# Patient Record
Sex: Female | Born: 1985 | Race: White | Hispanic: No | Marital: Married | Smoking: Never smoker
Health system: Southern US, Community
[De-identification: ages and names within clinical notes are randomized; demographics above are authoritative.]

## PROBLEM LIST (undated history)

## (undated) DIAGNOSIS — Z789 Other specified health status: Secondary | ICD-10-CM

## (undated) HISTORY — PX: WISDOM TOOTH EXTRACTION: SHX21

---

## 2014-12-22 ENCOUNTER — Encounter (HOSPITAL_COMMUNITY): Payer: Self-pay | Admitting: Obstetrics and Gynecology

## 2014-12-28 ENCOUNTER — Encounter (HOSPITAL_COMMUNITY): Payer: Self-pay

## 2014-12-28 ENCOUNTER — Ambulatory Visit (HOSPITAL_COMMUNITY)
Admission: RE | Admit: 2014-12-28 | Discharge: 2014-12-28 | Disposition: A | Discharge status: Home / Self Care | Payer: Federal, State, Local not specified - PPO | Source: Ambulatory Visit | Attending: Obstetrics and Gynecology | Admitting: Obstetrics and Gynecology

## 2014-12-28 ENCOUNTER — Other Ambulatory Visit (HOSPITAL_COMMUNITY): Payer: Self-pay | Admitting: Maternal and Fetal Medicine

## 2014-12-28 DIAGNOSIS — Z3A19 19 weeks gestation of pregnancy: Secondary | ICD-10-CM | POA: Diagnosis not present

## 2014-12-28 HISTORY — DX: Other specified health status: Z78.9

## 2015-01-03 NOTE — Progress Notes (Signed)
MATERNAL FETAL MEDICINE CONSULT  Patient Name: Diane Baker Medical Record Number:  [id number] Date of Birth: May 22, 1985 Requesting Physician Name:  Dr. Lamona Curl Date of Service: 01/03/2015  Chief Complaint Vasa previa  History of Present Illness Diane Baker was seen today secondary to vasa previa at the request of Dr. Lamona Curl.  The patient is a 29 y.o. G2P0010,at [redacted]w[redacted]d with an EDD of 05/23/2015, by Last Menstrual Period dating method.  She was noted to have a vasa previa on an ultrasound performed at her primary OBs office.  She comes today for ultrasound confirmation and discussions of management options.  Review of Systems Pertinent items are noted in HPI.  Patient History OB History  Gravida Para Term Preterm AB SAB TAB Ectopic Multiple Living  # Outcome Date GA Lbr Len/2nd Weight Sex Delivery Anes PTL Lv  2 Current           1 SAB               Past Medical History  Diagnosis Date  . Medical history non-contributory     Past Surgical History  Procedure Laterality Date  . Wisdom tooth extraction      Social History   Social History  . Marital Status: Married    Spouse Name: N/A  . Number of Children: N/A  . Years of Education: N/A   Social History Main Topics  . Smoking status: Never Smoker   . Smokeless tobacco: None  . Alcohol Use: No  . Drug Use: No  . Sexual Activity: Not Asked   Other Topics Concern  . None   Social History Narrative    No family history on file. In addition, the patient has no family history of mental retardation, birth defects, or genetic diseases.  Physical Examination There were no vitals filed for this visit. General appearance - alert, well appearing, and in no distress Abdomen - soft, nontender, nondistended, no masses or organomegaly Extremities - no pedal edema noted  Assessment and Recommendations 1.  Vasa Previa.  A vasa previa was confirmed on ultrasound.  There are several large vessels that cross  over or close to the internal os.  The management of vasa previa is controversial with several different approaches described in the literature.  The most aggressive includes hospitalization at the time of viability for close monitoring of labor followed by delivery via LTCS at 32 weeks.  This is to avoid spontaneous labor prior to delivery, as labor can result in a laceration of the fetal vessels near the cervix, that nearly always leads to fetal death if the patient is not delivered within minutes.  Less aggressive approaches include frequent outpatient visit to assess for symptoms of preterm labor with delivery at 34-36 weeks.  I think Ms. Printup ultimate management plan will be somewhere in between the two extremes.  The decision to hospitalize is driven mostly by the risk of preterm labor, which can be assessed by cervical length measurement.  If for example Ms. Riege was found to have a cervical length of greater than 2.5 cm around the time of viability indicating a low risk of preterm labor, it would be reasonable to delay hospitalization until later into pregnancy.  However, if she were having uterine activity and a shortened cervix hospitalization would be the best option.  We will reassess her risk of early labor at the time of her next ultrasound, which is scheduled in  approximately 4 weeks.  I spent 30 minutes with Ms. Vedia CofferBlack today of which 50% was face-to-face counseling.  [facility].  Please do not hesitate to contact us with questions.   Rema FendtNITSCHE,Lauris Serviss, MD

## 2015-01-06 ENCOUNTER — Other Ambulatory Visit (HOSPITAL_COMMUNITY): Payer: Self-pay

## 2015-01-06 ENCOUNTER — Encounter (HOSPITAL_COMMUNITY): Payer: Self-pay

## 2015-01-25 ENCOUNTER — Encounter (HOSPITAL_COMMUNITY): Payer: Self-pay

## 2015-01-25 ENCOUNTER — Ambulatory Visit (HOSPITAL_COMMUNITY)
Admission: RE | Admit: 2015-01-25 | Discharge: 2015-01-25 | Disposition: A | Discharge status: Home / Self Care | Payer: Federal, State, Local not specified - PPO | Source: Ambulatory Visit | Attending: Obstetrics and Gynecology | Admitting: Obstetrics and Gynecology

## 2015-01-25 DIAGNOSIS — Z36 Encounter for antenatal screening of mother: Secondary | ICD-10-CM | POA: Diagnosis not present

## 2015-01-25 DIAGNOSIS — Z3A23 23 weeks gestation of pregnancy: Secondary | ICD-10-CM | POA: Insufficient documentation

## 2015-02-08 ENCOUNTER — Other Ambulatory Visit (HOSPITAL_COMMUNITY): Payer: Self-pay | Admitting: Maternal and Fetal Medicine

## 2015-02-08 ENCOUNTER — Encounter (HOSPITAL_COMMUNITY): Payer: Self-pay

## 2015-02-08 ENCOUNTER — Ambulatory Visit (HOSPITAL_COMMUNITY)
Admission: RE | Admit: 2015-02-08 | Discharge: 2015-02-08 | Disposition: A | Discharge status: Home / Self Care | Payer: Federal, State, Local not specified - PPO | Source: Ambulatory Visit | Attending: Obstetrics and Gynecology | Admitting: Obstetrics and Gynecology

## 2015-02-08 DIAGNOSIS — Z3A25 25 weeks gestation of pregnancy: Secondary | ICD-10-CM

## 2015-02-23 ENCOUNTER — Other Ambulatory Visit (HOSPITAL_COMMUNITY): Payer: Self-pay | Admitting: Maternal and Fetal Medicine

## 2015-02-23 ENCOUNTER — Ambulatory Visit (HOSPITAL_COMMUNITY)
Admission: RE | Admit: 2015-02-23 | Discharge: 2015-02-23 | Disposition: A | Discharge status: Home / Self Care | Payer: Federal, State, Local not specified - PPO | Source: Ambulatory Visit | Attending: Obstetrics and Gynecology | Admitting: Obstetrics and Gynecology

## 2015-02-23 ENCOUNTER — Encounter (HOSPITAL_COMMUNITY): Payer: Self-pay

## 2015-02-23 DIAGNOSIS — Z3A27 27 weeks gestation of pregnancy: Secondary | ICD-10-CM | POA: Diagnosis not present

## 2015-03-09 ENCOUNTER — Ambulatory Visit (HOSPITAL_COMMUNITY)
Admission: RE | Admit: 2015-03-09 | Discharge: 2015-03-09 | Disposition: A | Discharge status: Home / Self Care | Payer: Federal, State, Local not specified - PPO | Source: Ambulatory Visit | Attending: Obstetrics and Gynecology | Admitting: Obstetrics and Gynecology

## 2015-03-09 ENCOUNTER — Encounter (HOSPITAL_COMMUNITY): Payer: Self-pay

## 2015-03-09 DIAGNOSIS — Z36 Encounter for antenatal screening of mother: Secondary | ICD-10-CM | POA: Insufficient documentation

## 2015-03-09 DIAGNOSIS — Z3A29 29 weeks gestation of pregnancy: Secondary | ICD-10-CM | POA: Insufficient documentation

## 2015-03-09 DIAGNOSIS — O24410 Gestational diabetes mellitus in pregnancy, diet controlled: Secondary | ICD-10-CM | POA: Diagnosis not present

## 2015-03-15 ENCOUNTER — Ambulatory Visit (HOSPITAL_COMMUNITY): Payer: Federal, State, Local not specified - PPO

## 2015-03-16 ENCOUNTER — Ambulatory Visit (HOSPITAL_COMMUNITY): Payer: Federal, State, Local not specified - PPO

## 2015-11-02 ENCOUNTER — Encounter (HOSPITAL_COMMUNITY): Payer: Self-pay

## 2017-05-30 ENCOUNTER — Encounter (HOSPITAL_COMMUNITY): Payer: Self-pay

## 2017-05-30 ENCOUNTER — Ambulatory Visit (HOSPITAL_COMMUNITY): Payer: Federal, State, Local not specified - PPO | Attending: Obstetrics and Gynecology

## 2017-11-05 IMAGING — US US MFM OB TRANSVAGINAL
1 series · 14 of 28 positions shown · non-contrast
Comparison: none

[Series 1: us mfm ob transvaginal · 76 acquisitions, 14 frames shown]
[im 3/76]
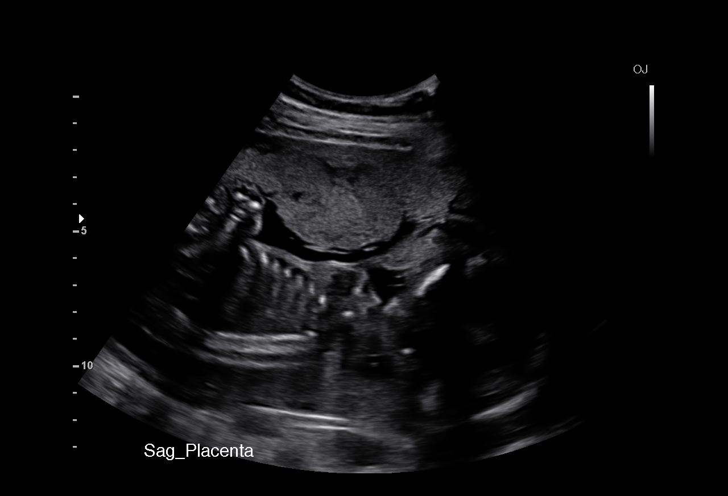
[im 9/76]
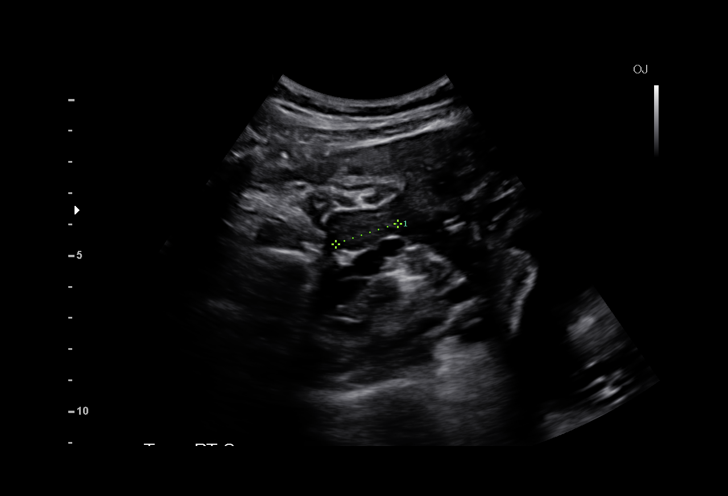
[im 14/76]
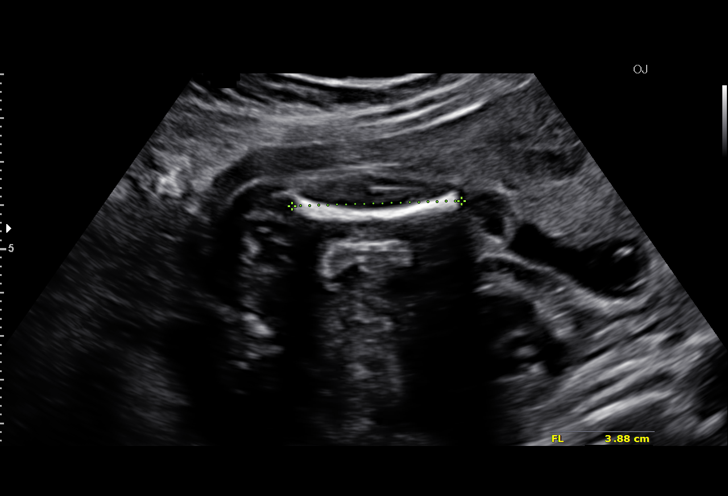
[im 20/76]
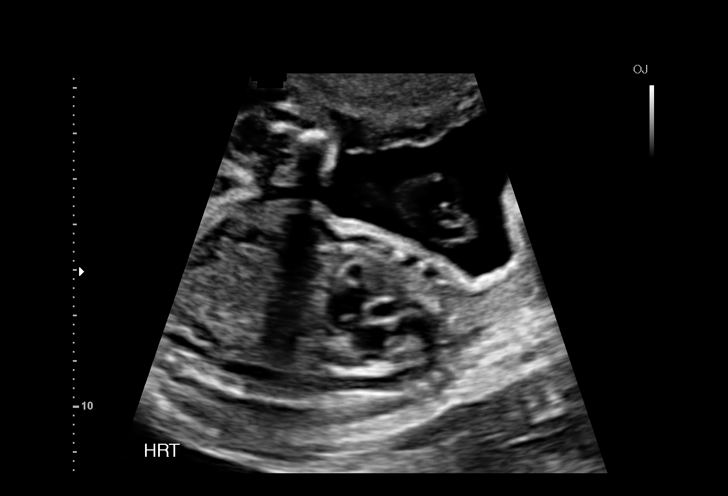
[im 26/76]
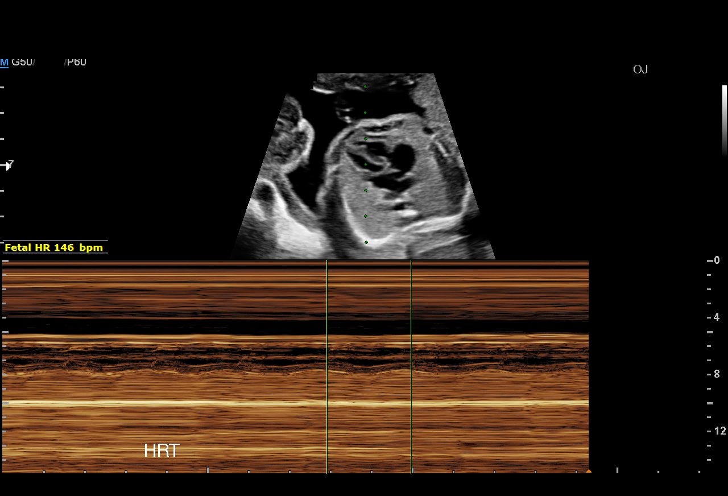
[im 31/76]
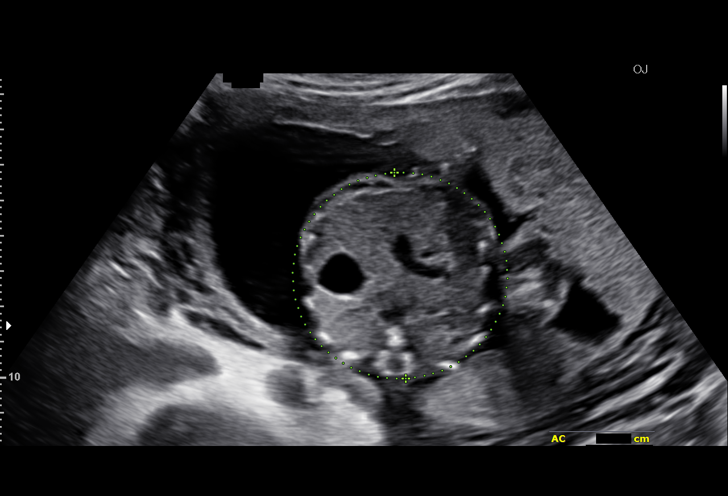
[im 37/76]
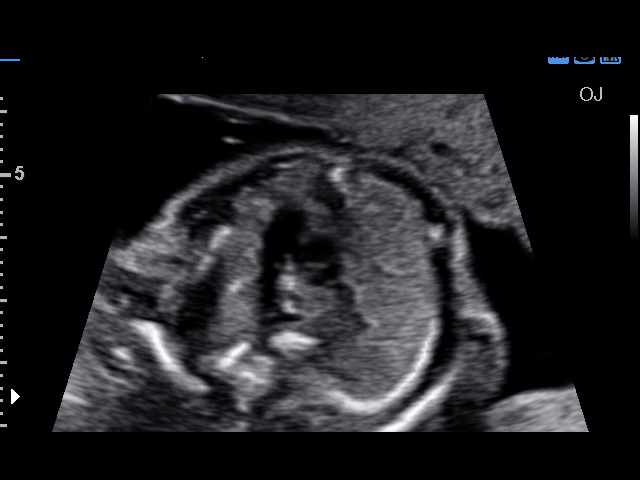
[im 42/76]
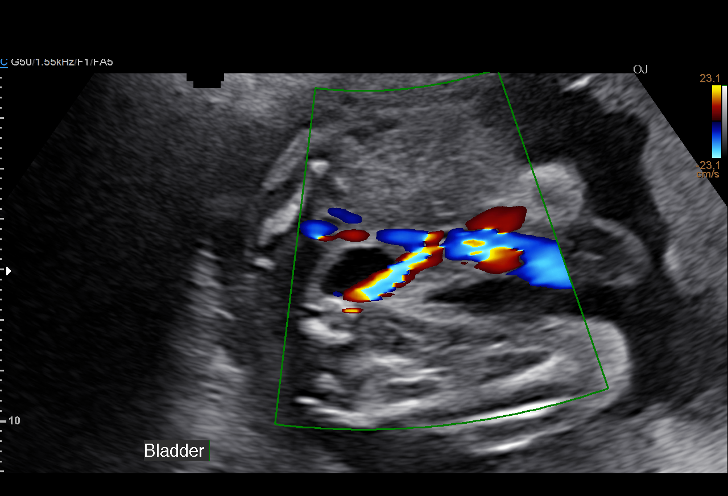
[im 48/76]
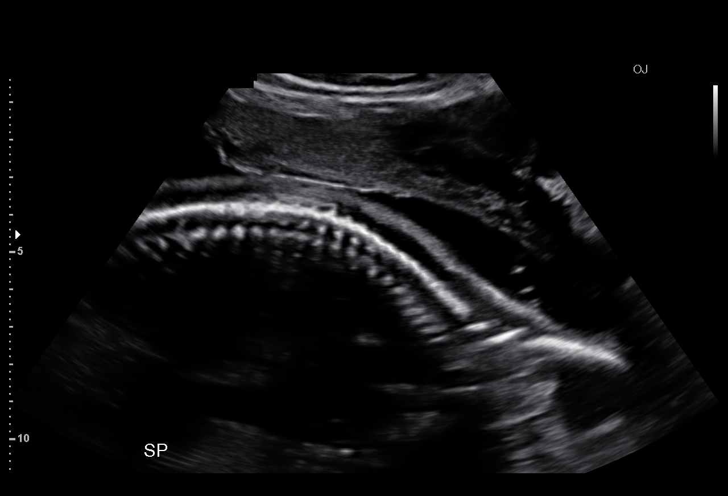
[im 53/76]
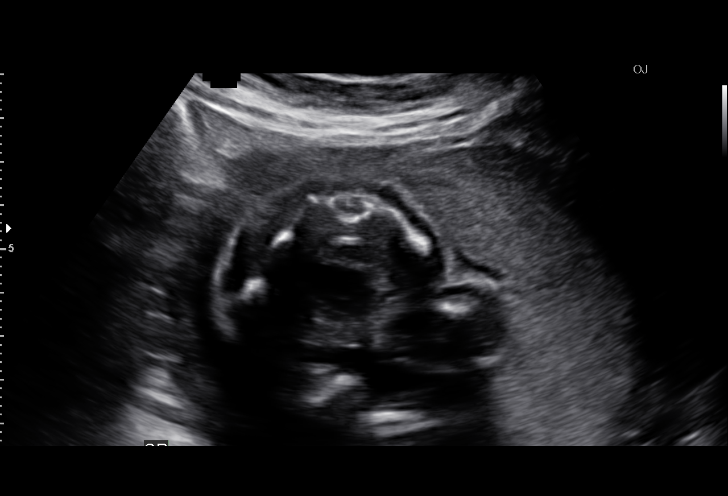
[im 59/76]
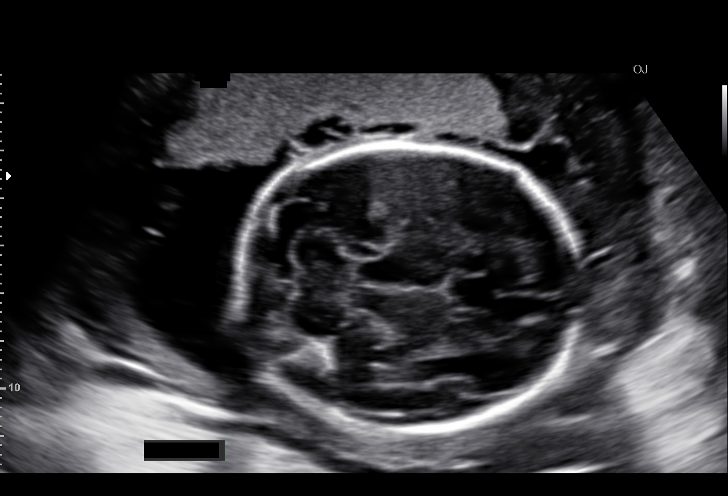
[im 64/76]
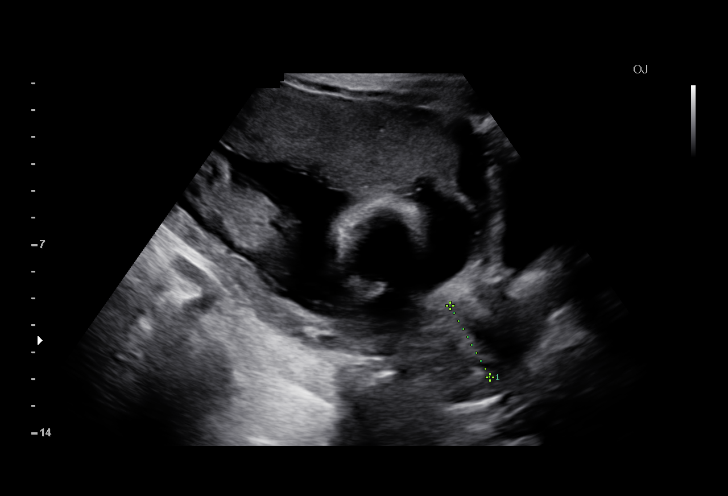
[im 70/76]
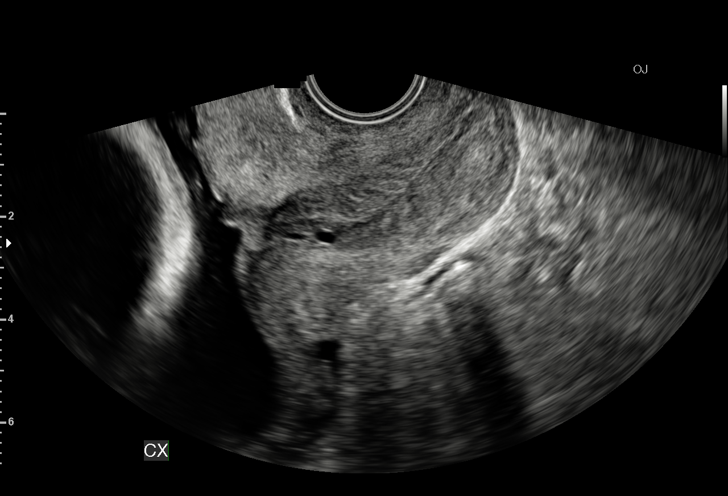
[im 76/76]
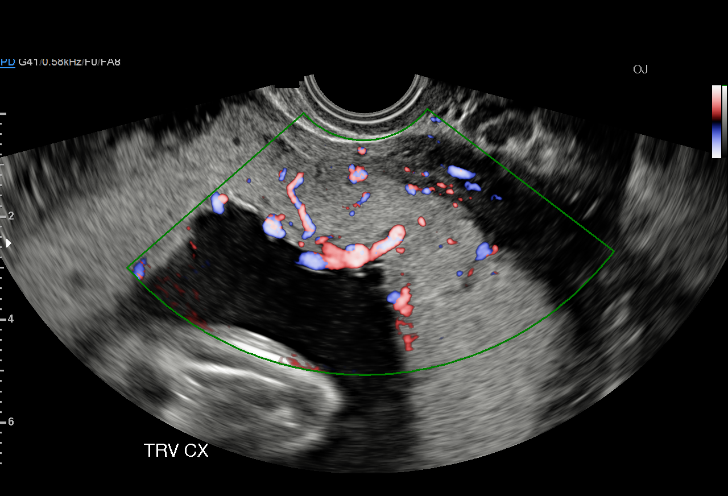

[14 of 28 positions shown; findings below may reference images not displayed]

[REDACTED],
[address]
[HOSPITAL], [place]
MAN KUI DO

1  SOLITARIO SAX           [id number]      [id number]     [id number]
2  SOLITARIO SAX           [id number]      [id number]     [id number]
Indications

23 weeks gestation of pregnancy
Basic anatomic survey                          Z36
Vasa previa
OB History

Gravidity:    2         Term:   0        Prem:   0        SAB:   1
TOP:          0       Ectopic:  0        Living: 0
Fetal Evaluation

Num Of Fetuses:     1
Fetal Heart         146
Rate(bpm):
Cardiac Activity:   Observed
Presentation:       Cephalic
Placenta:           Anterior left lateral
P. Cord Insertion:  Velamentous insertion

Amniotic Fluid
AFI FV:      Subjectively within normal limits
Larg Pckt:     5.9  cm
Biometry

BPD:      59.5  mm     G. Age:  24w 2d                  CI:        75.83   %    70 - 86
FL/HC:      17.7   %    19.2 -
HC:      216.6  mm     G. Age:  23w 5d         57  %    HC/AC:      1.19        1.05 -
AC:      182.6  mm     G. Age:  23w 0d         40  %    FL/BPD:     64.4   %    71 - 87
FL:       38.3  mm     G. Age:  22w 2d         15  %    FL/AC:      21.0   %    20 - 24
HUM:      37.1  mm     G. Age:  23w 0d         39  %
CER:      23.5  mm     G. Age:  21w 5d         24  %

Est. FW:     543  gm      1 lb 3 oz     49  %
Gestational Age

LMP:           23w 1d        Date:  08/16/14                 EDD:   05/23/15
U/S Today:     23w 2d                                        EDD:   05/22/15
Best:          23w 1d     Det. By:  LMP  (08/16/14)          EDD:   05/23/15
Anatomy

Cranium:          Appears normal         Aortic Arch:      Appears normal
Fetal Cavum:      Appears normal         Ductal Arch:      Appears normal
Ventricles:       Appears normal         Diaphragm:        Appears normal
Choroid Plexus:   Appears normal         Stomach:          Appears normal, left
sided
Cerebellum:       Appears normal         Abdomen:          Appears normal
Posterior Fossa:  Appears normal         Abdominal Wall:   Appears nml (cord
insert, abd wall)
Nuchal Fold:      Appears normal         Cord Vessels:     Appears normal (3
vessel cord)
Face:             Appears normal         Kidneys:          Appear normal
(orbits and profile)
Lips:             Appears normal         Bladder:          Appears normal
Heart:            Appears normal         Spine:            Appears normal
(4CH, axis, and
situs)
RVOT:             Appears normal         Upper             Appears normal
Extremities:
LVOT:             Appears normal         Lower             Appears normal
Extremities:

Other:  Male gender. Heels visualized.
Cervix Uterus Adnexa

Cervix
Length:              3  cm.
Normal appearance by transabdominal scan.

Left Ovary
Size(cm)     2.53   x   1.29   x  2.1       Vol(ml):
Within normal limits.

Right Ovary
Size(cm)     2.64   x   1.11   x  2.09      Vol(ml):
Within normal limits.
Impression

Single IUP at 23w 1d
Follow up due to suspected vasa previa
Normal fetal anaatmic survey
Fetal growth is appropriate (49th %tile)
Normal amniotic fluid volume
A velamentous cord insertion is noted along the leading edge
of the anterior placenta.
Some fetal vessels are again appreciated that course over
the internal os consistent with vasa previa
TVUS - cervical length 3 cm without funneling
Recommendations

Recommend follow up ultrasound in 2 weeks for cervical
length.
If shortened cervix is noted (< 2.5 cm) on follow up, would
consider inpatient obsertation and betamethsasone.
If stable and vasa previa is again confirmed, would consider
inpatient admission at 30-32 weeks with planned C-section at

34 weeks.

## 2017-11-19 IMAGING — US US MFM OB TRANSVAGINAL
1 series · 15 of 28 positions shown · non-contrast
Comparison: none

[Series 1: us mfm ob transvaginal · 33 acquisitions, 15 frames shown]
[im 1/33]
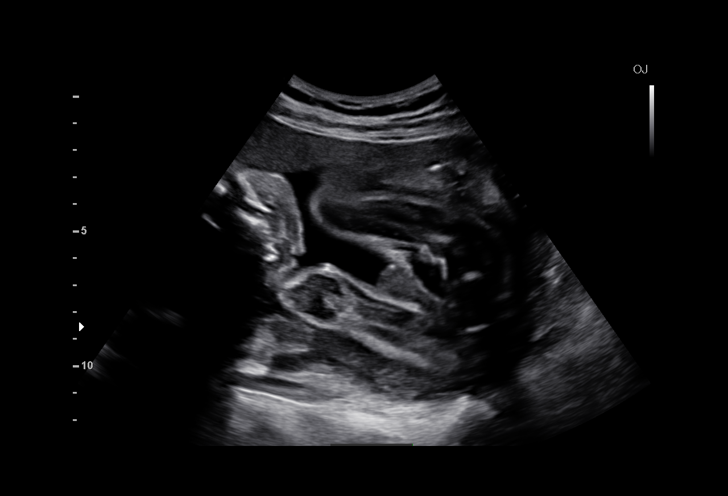
[im 3/33]
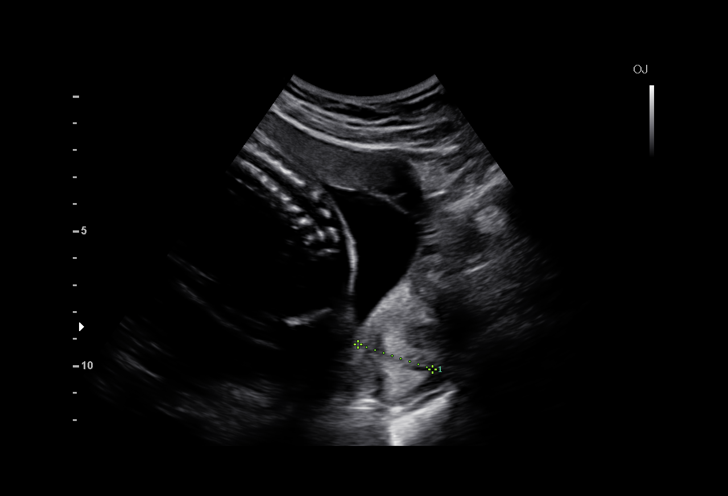
[im 5/33]
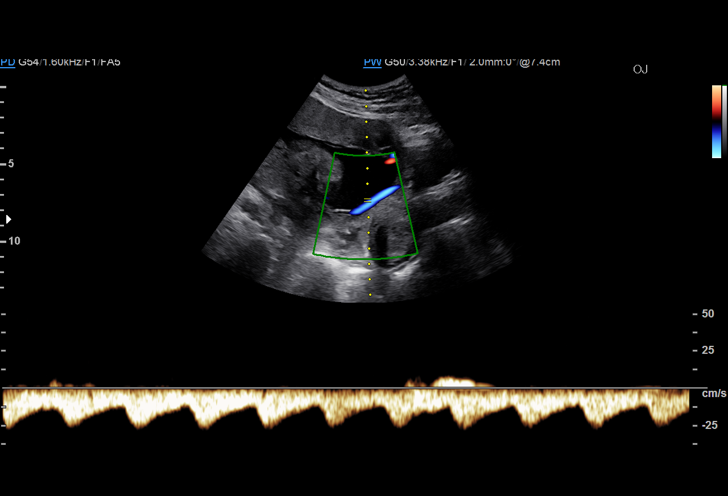
[im 8/33]
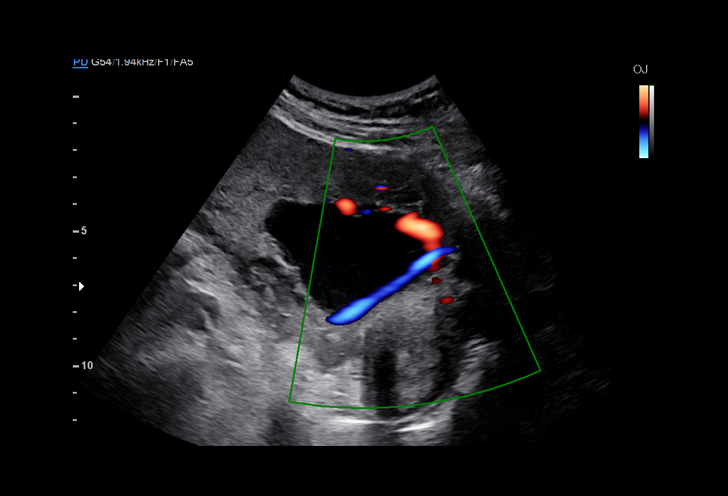
[im 10/33]
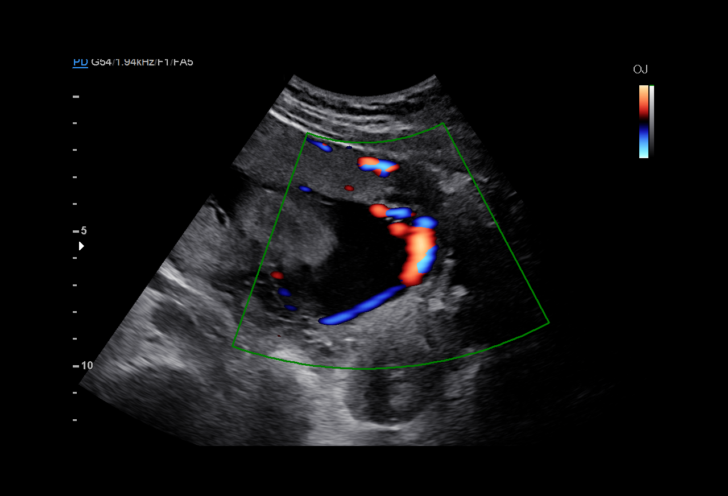
[im 12/33]
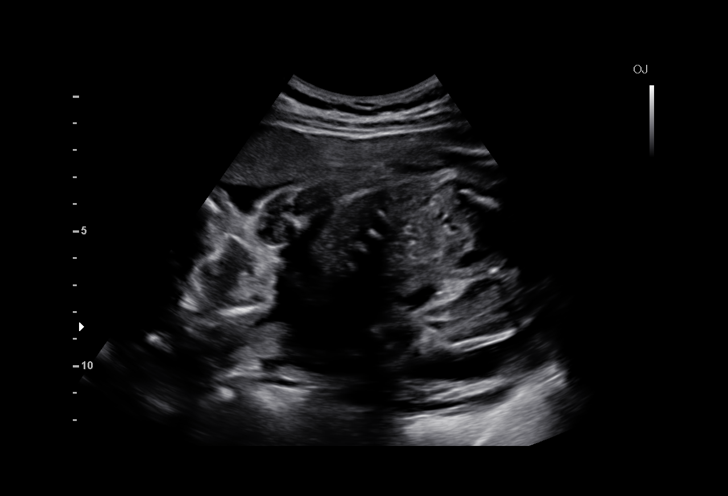
[im 15/33]
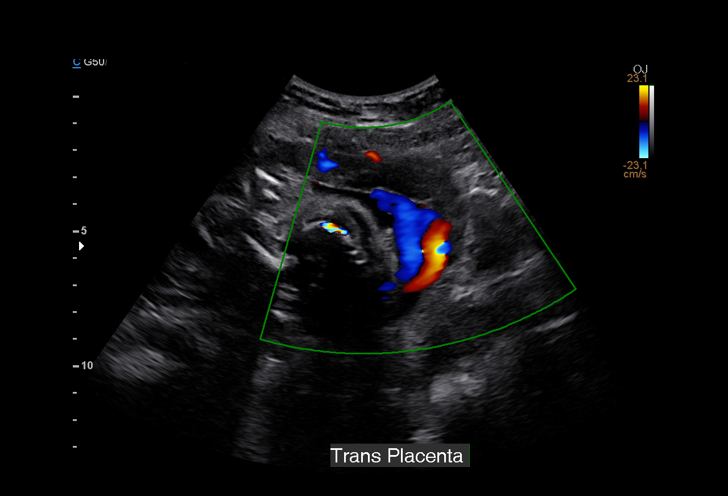
[im 17/33]
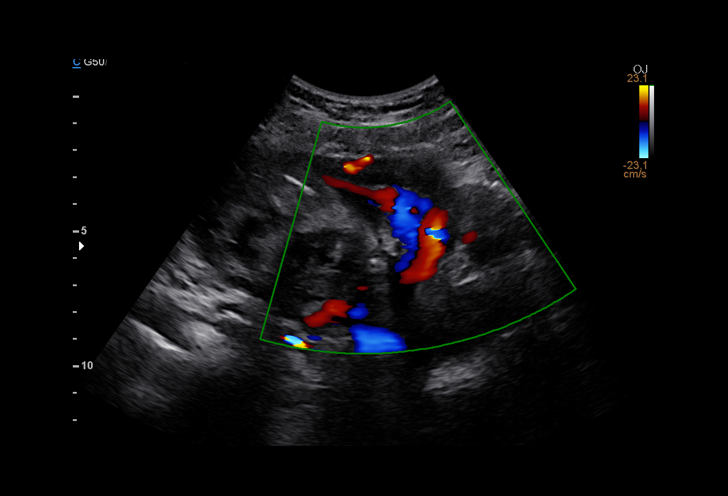
[im 18/33]
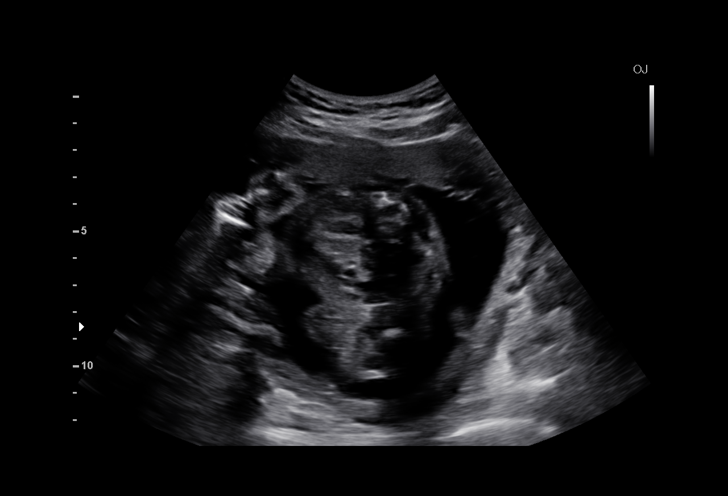
[im 21/33]
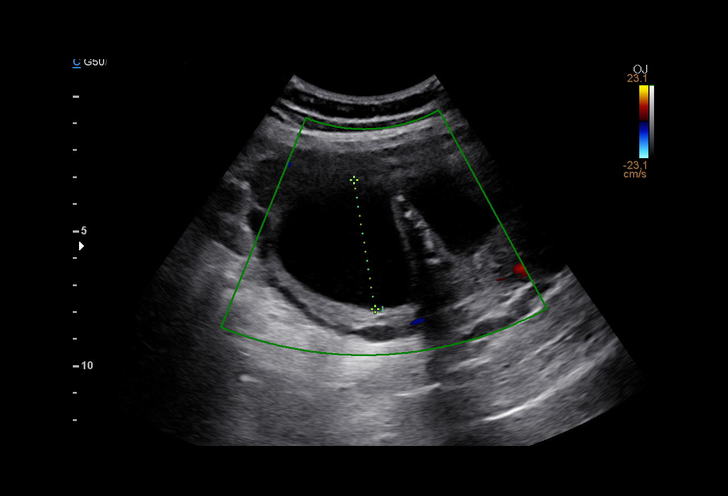
[im 23/33]
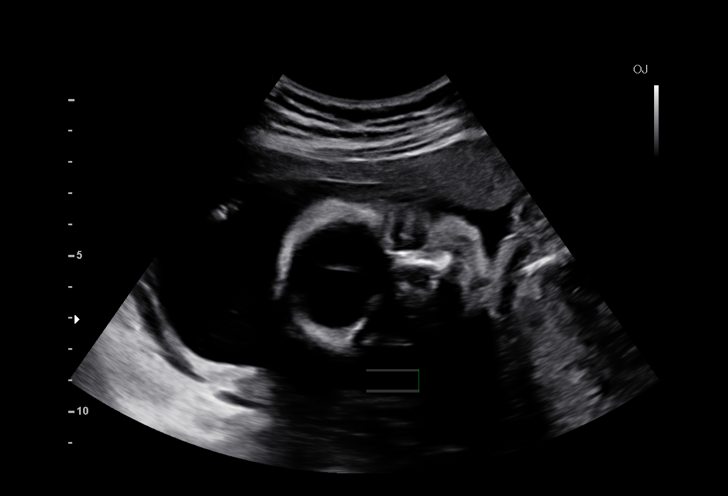
[im 25/33]
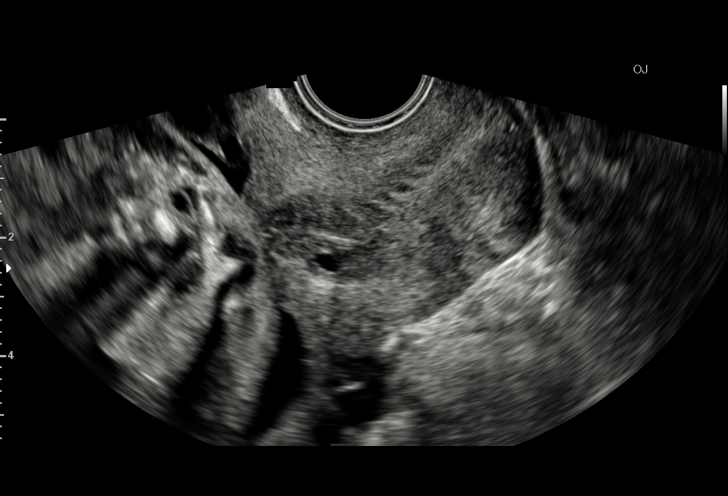
[im 28/33]
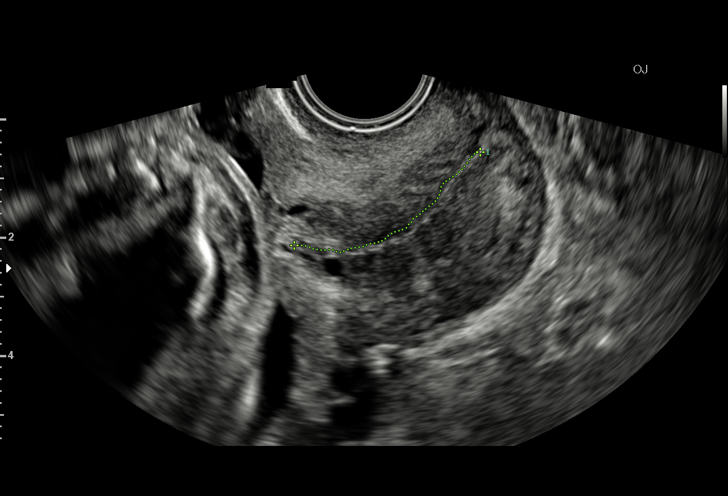
[im 30/33]
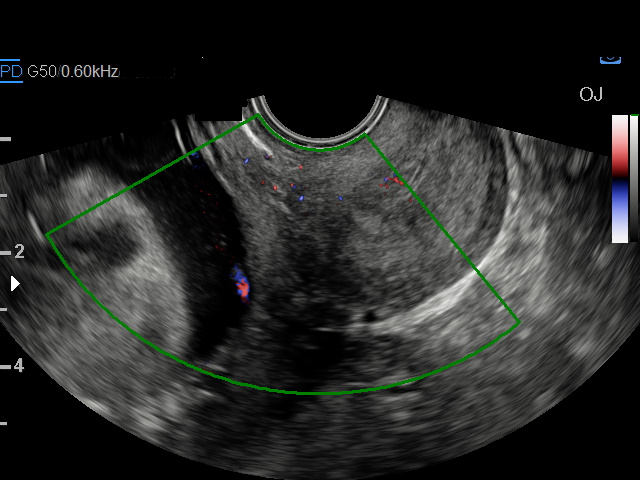
[im 33/33]
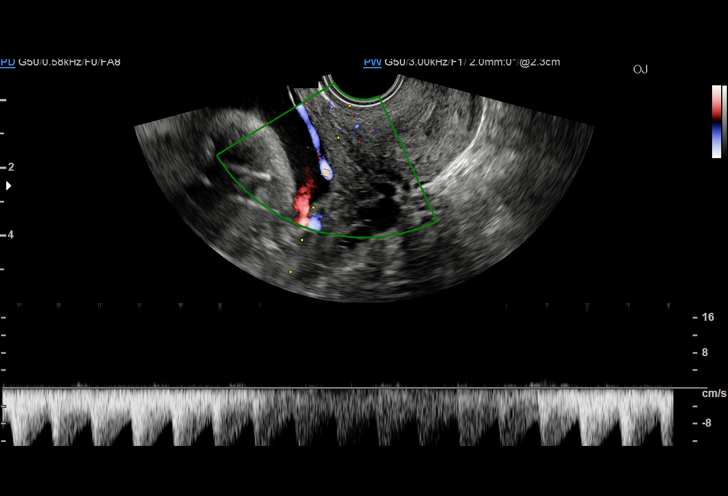

[15 of 28 positions shown; findings below may reference images not displayed]

[REDACTED],
[address]
[HOSPITAL], [place]
CHAI DO

1  BYRON ROBERTO MANOLO            [id number]      [id number]     [id number]
Indications

25 weeks gestation of pregnancy
Vasa previa
OB History

Gravidity:    2         Term:   0        Prem:   0        SAB:   1
TOP:          0       Ectopic:  0        Living: 0
Fetal Evaluation

Num Of Fetuses:     1
Fetal Heart         155
Rate(bpm):
Cardiac Activity:   Observed
Presentation:       Breech
Placenta:           Anterior left lateral
P. Cord Insertion:  Velamentous insertion

Amniotic Fluid
AFI FV:      Subjectively within normal limits
Larg Pckt:     4.9  cm
Gestational Age

LMP:           25w 1d        Date:  08/16/14                 EDD:   05/23/15
Best:          25w 1d     Det. By:  LMP  (08/16/14)          EDD:   05/23/15
Cervix Uterus Adnexa

Cervix
Length:            3.9  cm.
Normal appearance by transvaginal scan
Comments

The vasa previa is again seen.  It appears unchanged from
previous studies.  Her cervix is measuring 3.9 cm today,
indicating a low risk of preterm labor in the short term.  We
again discussed the plans of hospitalization at 30-32 weeks
followed by a delivery via cesarean section at 34 weeks.  She
is uncertain as to when exactly she wishes to be admitted
and which hospital she wishes to be admitted to ([facility] consider the matter further and make a decision around
the time of her visit at 30 weeks.
Impression

Single living intrauterine pregnancy at 25 weeks 1 days.
Normal amniotic fluid volume
A velamentous cord insertion is noted along the leading edge
of the anterior placenta.
Some fetal vessels are again appreciated that course over
the internal os consistent with vasa previa
Transvaginal cervical length 3.9 cm without funneling
Recommendations

Recommend follow-up ultrasound examination in 2 and 5
weeks.

## 2017-12-18 IMAGING — US US MFM OB TRANSVAGINAL
2 series · 15 of 28 positions shown · non-contrast
Comparison: none

[Series 1: us mfm ob transvaginal · 32 acquisitions, 13 frames shown (1 of 2)]
[im 1/32]
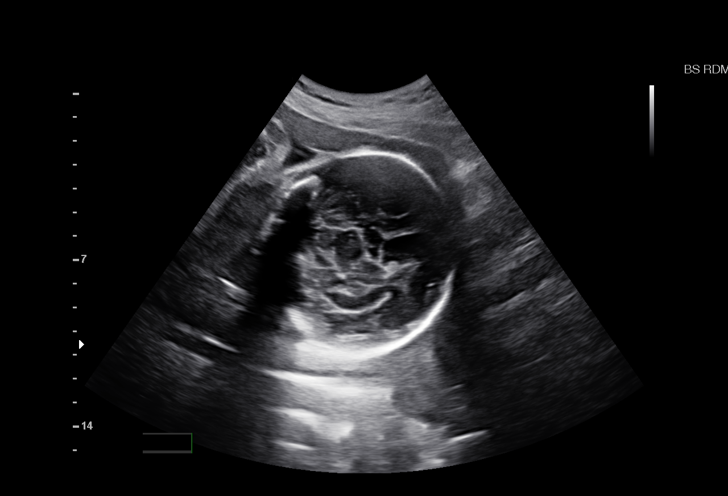
[im 3/32]
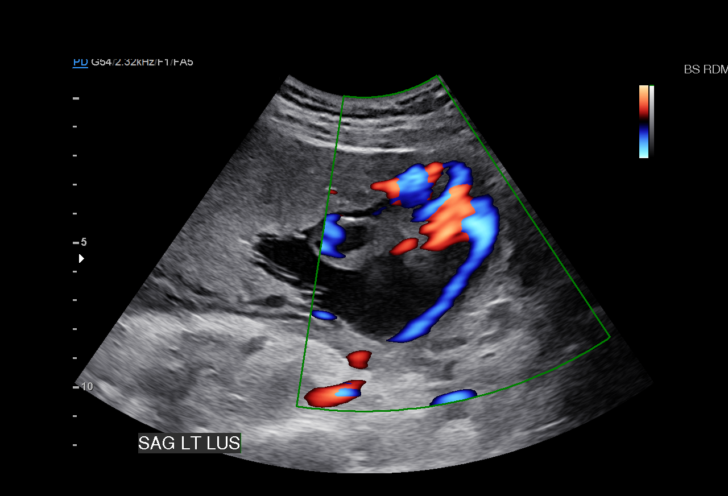
[im 6/32]
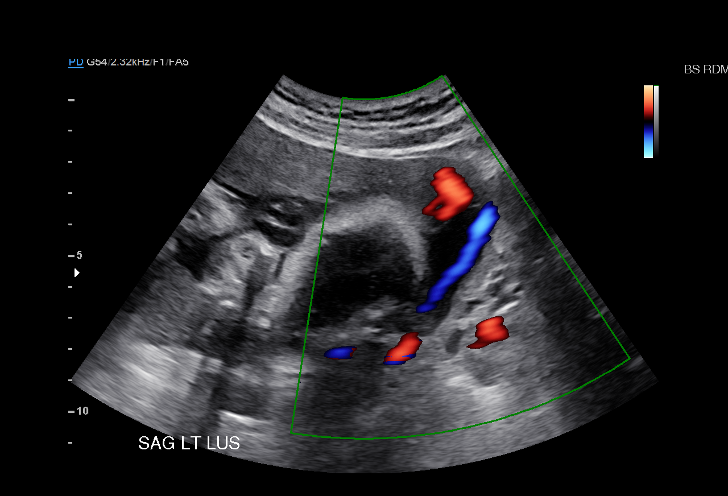
[im 8/32]
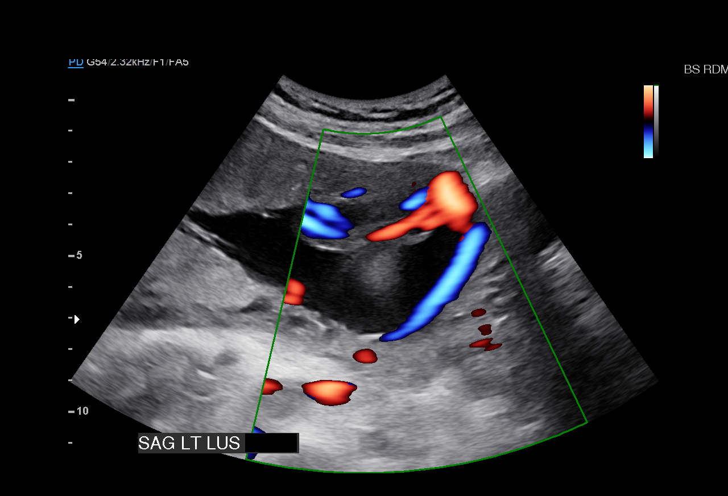
[im 11/32]
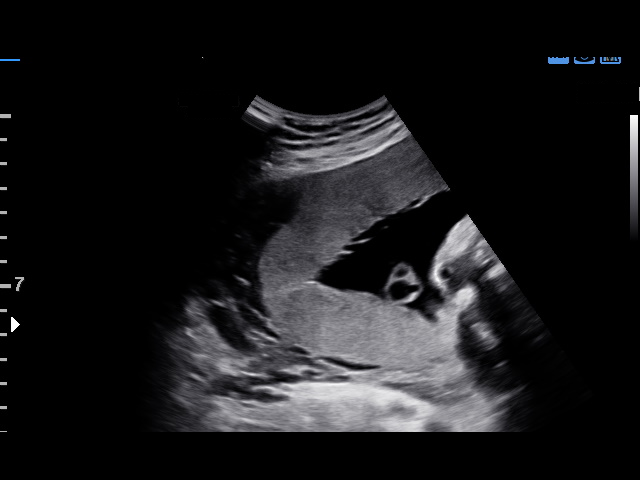
[im 13/32]
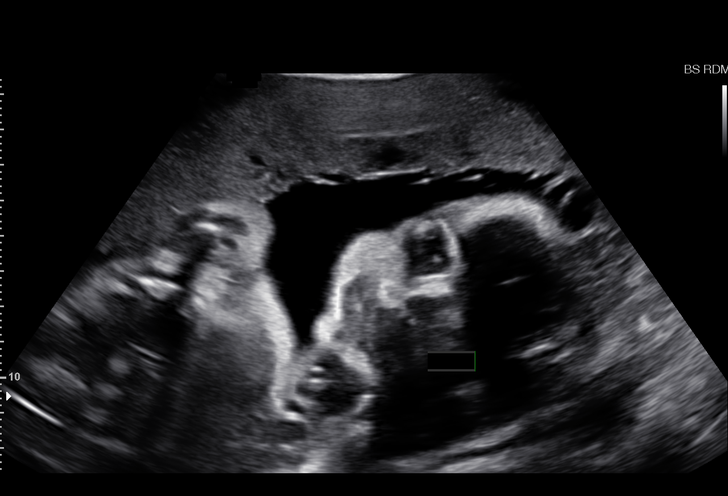
[im 16/32]
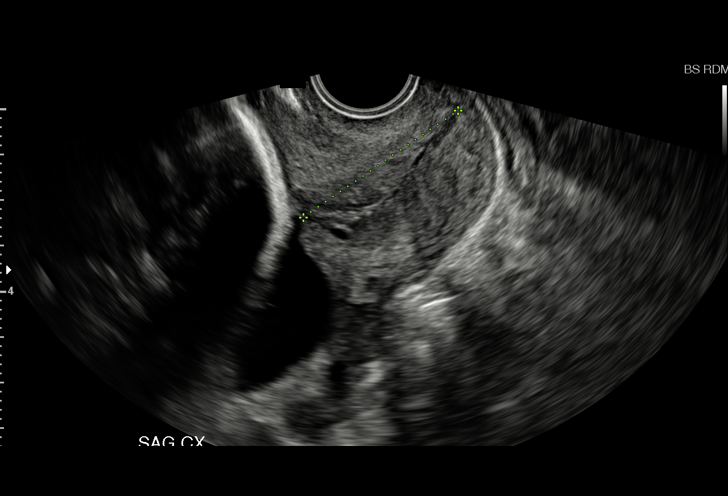
[im 19/32]
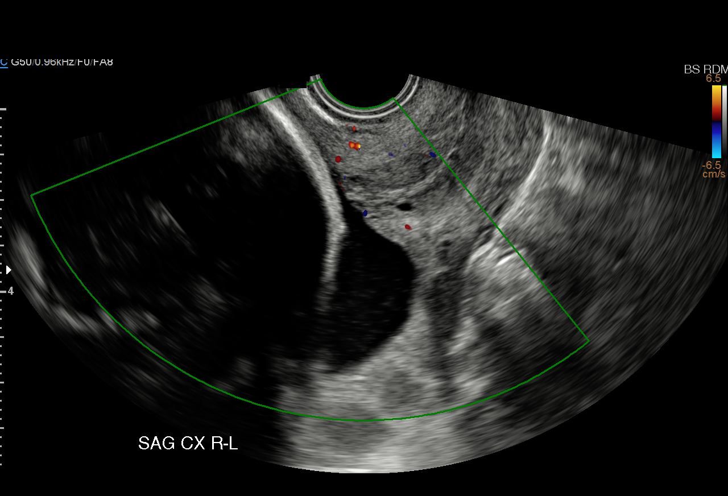
[im 20/32]
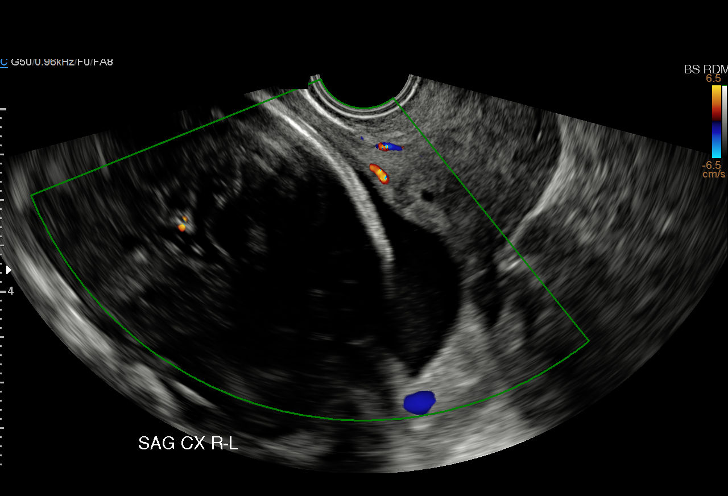
[im 22/32]
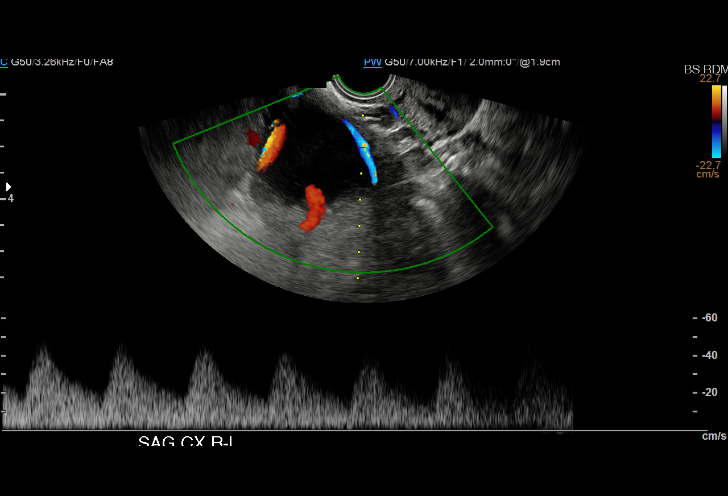
[im 25/32]
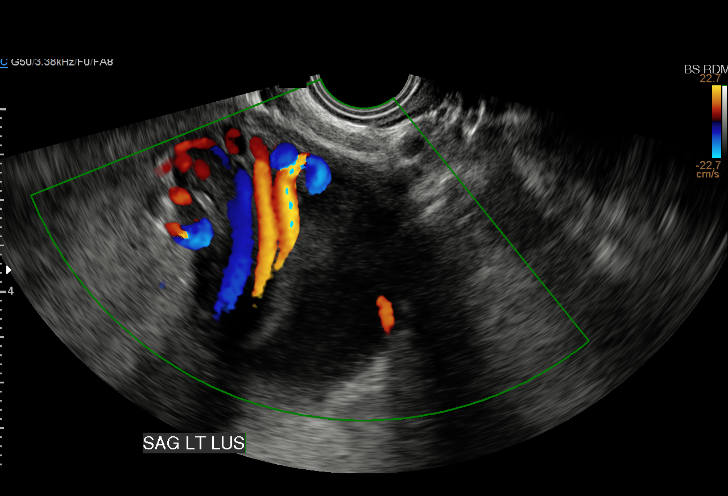
[im 28/32]
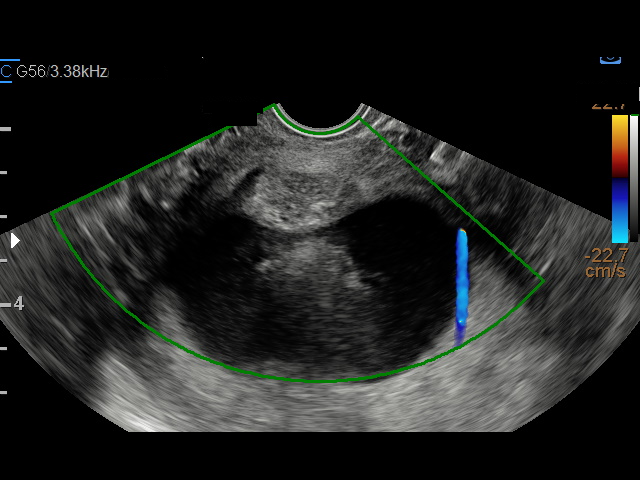
[im 30/32]
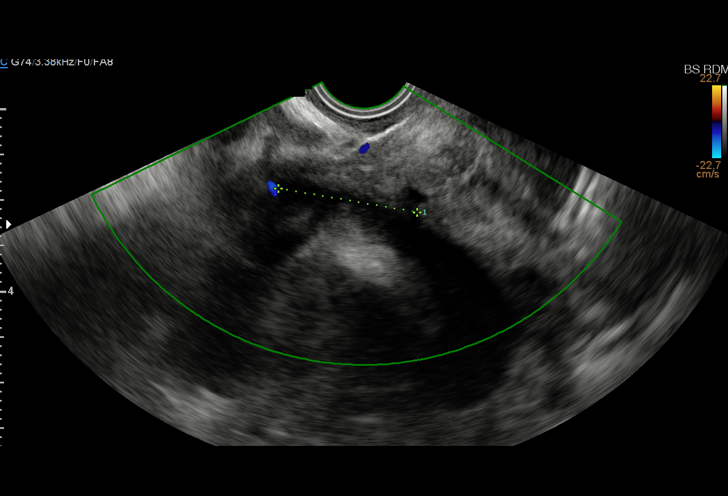

[Series 3: us mfm ob transvaginal · 2 of 4 slices shown (2 of 2)]
[im 1/4]
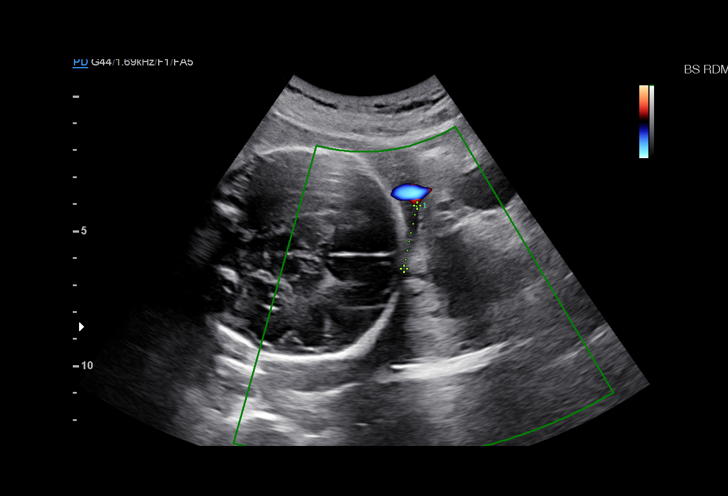
[im 4/4]
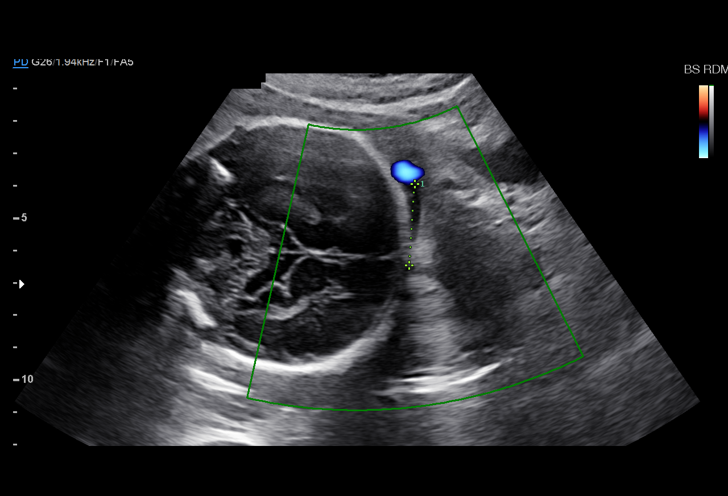

[15 of 28 positions shown; findings below may reference images not displayed]

[REDACTED],
[address]
[HOSPITAL], [place]
STEVI DO

1  YEIMI THAKER           [id number]      [id number]     [id number]
Indications

Vasa previa
Gestational diabetes in pregnancy, diet
controlled
29 weeks gestation of pregnancy
OB History

Gravidity:    2         Term:   0        Prem:   0        SAB:   1
TOP:          0       Ectopic:  0        Living: 0
Fetal Evaluation

Num Of Fetuses:     1
Fetal Heart         124
Rate(bpm):
Cardiac Activity:   Observed
Presentation:       Cephalic
Placenta:           Anterior Left lateral, above cervical os
P. Cord Insertion:  Velamentous insertion

Amniotic Fluid
AFI FV:      Subjectively within normal limits
Gestational Age

LMP:           29w 2d        Date:  08/16/14                 EDD:   05/23/15
Best:          29w 2d     Det. By:  LMP  (08/16/14)          EDD:   05/23/15
Cervix Uterus Adnexa
Cervix
Length:            3.5  cm.
Normal appearance by transvaginal scan
Comments

Ms. Visad returns for follow up due to suspected vasa previa.
Fetal vessels are seen to theleft of the internal os
(approximately 2.5-2.6 cm).  Some have proposed a distance
of 2 cm from the internal os as the critical distance for vasa
previa.  While the vessels appear to exceed this distance, the
fetal vessels are seen very close to the fetal head (presenting
part) and would be hesitant to allow a vaginal delivery unless
the vessels move further away from the presenting and cervix.
Impression

Single IUP at 29w 2d
Follow up ultrasound to evaluate for possible vasa previa
An anterior placenta is noted with a velamentous cord
insertion
Fetal vessels are appreciated that course approximately 2.5-
2.6 cm to the left of the interal os, but clearly not directedly
over the cervix
Cervival length is 3.5 cm without funneling.
Normal amniotic fluid volume
Recommendations

Ms. Faxx Tiger return for cervical length next week
Will reevaluate the fetal vessels at PAC ([REDACTED]) in 2 weeks - if concerned about vasa previa at that
time, would plan admission for a course of betamethsone and
inpatient observation until delivery (
34-35 weeks gestation)
